# Patient Record
Sex: Female | Born: 1990 | Race: White | Hispanic: No | Marital: Single
Health system: Southern US, Community
[De-identification: ages and names within clinical notes are randomized; demographics above are authoritative.]

---

## 2013-02-11 ENCOUNTER — Emergency Department: Payer: Self-pay | Admitting: Emergency Medicine

## 2013-02-11 LAB — URINALYSIS, COMPLETE
BILIRUBIN, UR: NEGATIVE
BLOOD: NEGATIVE
Glucose,UR: NEGATIVE mg/dL (ref 0–75)
Leukocyte Esterase: NEGATIVE
Nitrite: NEGATIVE
PH: 5 (ref 4.5–8.0)
PROTEIN: NEGATIVE
RBC,UR: 1 /HPF (ref 0–5)
Specific Gravity: 1.025 (ref 1.003–1.030)
Squamous Epithelial: 1
WBC UR: 3 /HPF (ref 0–5)

## 2013-02-11 LAB — COMPREHENSIVE METABOLIC PANEL
ANION GAP: 7 (ref 7–16)
Albumin: 4.3 g/dL (ref 3.4–5.0)
Alkaline Phosphatase: 91 U/L
BUN: 17 mg/dL (ref 7–18)
Bilirubin,Total: 0.7 mg/dL (ref 0.2–1.0)
CHLORIDE: 106 mmol/L (ref 98–107)
CO2: 23 mmol/L (ref 21–32)
Calcium, Total: 9.5 mg/dL (ref 8.5–10.1)
Creatinine: 0.86 mg/dL (ref 0.60–1.30)
EGFR (African American): >60
EGFR (Non-African Amer.): >60
GLUCOSE: 111 mg/dL — AB (ref 65–99)
OSMOLALITY: 274 (ref 275–301)
Potassium: 4 mmol/L (ref 3.5–5.1)
SGOT(AST): 24 U/L (ref 15–37)
SGPT (ALT): 25 U/L (ref 12–78)
SODIUM: 136 mmol/L (ref 136–145)
Total Protein: 8.6 g/dL — ABNORMAL HIGH (ref 6.4–8.2)

## 2013-02-11 LAB — CBC WITH DIFFERENTIAL/PLATELET
BASOS PCT: 0.3 %
Basophil #: 0.1 10*3/uL (ref 0.0–0.1)
Eosinophil #: 0.1 10*3/uL (ref 0.0–0.7)
Eosinophil %: 0.5 %
HCT: 43.8 % (ref 35.0–47.0)
HGB: 14.6 g/dL (ref 12.0–16.0)
LYMPHS PCT: 2.4 %
Lymphocyte #: 0.4 10*3/uL — ABNORMAL LOW (ref 1.0–3.6)
MCH: 30 pg (ref 26.0–34.0)
MCHC: 33.4 g/dL (ref 32.0–36.0)
MCV: 90 fL (ref 80–100)
Monocyte #: 0.7 x10 3/mm (ref 0.2–0.9)
Monocyte %: 4 %
NEUTROS ABS: 16.1 10*3/uL — AB (ref 1.4–6.5)
Neutrophil %: 92.8 %
Platelet: 242 10*3/uL (ref 150–440)
RBC: 4.88 10*6/uL (ref 3.80–5.20)
RDW: 13 % (ref 11.5–14.5)
WBC: 17.3 10*3/uL — ABNORMAL HIGH (ref 3.6–11.0)

## 2013-02-11 LAB — LIPASE, BLOOD: LIPASE: 113 U/L (ref 73–393)

## 2013-03-04 ENCOUNTER — Ambulatory Visit: Payer: Self-pay | Admitting: Obstetrics & Gynecology

## 2013-03-04 LAB — CBC
HCT: 40.6 % (ref 35.0–47.0)
HGB: 13.4 g/dL (ref 12.0–16.0)
MCH: 29.6 pg (ref 26.0–34.0)
MCHC: 33.1 g/dL (ref 32.0–36.0)
MCV: 89 fL (ref 80–100)
Platelet: 311 10*3/uL (ref 150–440)
RBC: 4.54 10*6/uL (ref 3.80–5.20)
RDW: 13.1 % (ref 11.5–14.5)
WBC: 7.7 10*3/uL (ref 3.6–11.0)

## 2013-03-04 LAB — PREGNANCY, URINE: Pregnancy Test, Urine: NEGATIVE m[IU]/mL

## 2013-03-21 ENCOUNTER — Ambulatory Visit: Payer: Self-pay | Admitting: Obstetrics & Gynecology

## 2013-03-24 ENCOUNTER — Emergency Department: Payer: Self-pay | Admitting: Emergency Medicine

## 2013-03-25 LAB — URINALYSIS, COMPLETE
Bacteria: NONE SEEN
Bilirubin,UR: NEGATIVE
Glucose,UR: NEGATIVE mg/dL (ref 0–75)
KETONE: NEGATIVE
LEUKOCYTE ESTERASE: NEGATIVE
Nitrite: NEGATIVE
Ph: 7 (ref 4.5–8.0)
Protein: NEGATIVE
RBC,UR: 80 /HPF (ref 0–5)
Specific Gravity: 1.018 (ref 1.003–1.030)
Squamous Epithelial: 4
Transitional Epi: <1
WBC UR: 5 /HPF (ref 0–5)

## 2013-03-25 LAB — COMPREHENSIVE METABOLIC PANEL
ALT: 27 U/L (ref 12–78)
ANION GAP: 3 — AB (ref 7–16)
Albumin: 3.6 g/dL (ref 3.4–5.0)
Alkaline Phosphatase: 67 U/L
BUN: 9 mg/dL (ref 7–18)
Bilirubin,Total: 0.4 mg/dL (ref 0.2–1.0)
CHLORIDE: 104 mmol/L (ref 98–107)
Calcium, Total: 9.1 mg/dL (ref 8.5–10.1)
Co2: 33 mmol/L — ABNORMAL HIGH (ref 21–32)
Creatinine: 0.76 mg/dL (ref 0.60–1.30)
EGFR (African American): >60
EGFR (Non-African Amer.): >60
GLUCOSE: 88 mg/dL (ref 65–99)
Osmolality: 278 (ref 275–301)
POTASSIUM: 3.9 mmol/L (ref 3.5–5.1)
SGOT(AST): 17 U/L (ref 15–37)
Sodium: 140 mmol/L (ref 136–145)
Total Protein: 7.4 g/dL (ref 6.4–8.2)

## 2013-03-25 LAB — CBC
HCT: 35.8 % (ref 35.0–47.0)
HGB: 11.9 g/dL — AB (ref 12.0–16.0)
MCH: 30 pg (ref 26.0–34.0)
MCHC: 33.3 g/dL (ref 32.0–36.0)
MCV: 90 fL (ref 80–100)
Platelet: 251 10*3/uL (ref 150–440)
RBC: 3.99 10*6/uL (ref 3.80–5.20)
RDW: 12.9 % (ref 11.5–14.5)
WBC: 7.2 10*3/uL (ref 3.6–11.0)

## 2013-03-27 LAB — PATHOLOGY REPORT

## 2014-04-26 NOTE — Op Note (Signed)
PATIENT NAME:  Kelli Daniel, [name] MR#:  [id number] DATE OF BIRTH:  [birth year]  DATE OF PROCEDURE:  03/21/2013  PREOPERATIVE DIAGNOSIS: Right ovarian cyst.  POSTOPERATIVE DIAGNOSIS: Right ovarian dermoid cyst.   SURGEON:  R. Annamarie MajorPaul Cristiana Yochim, MD  ANESTHESIA: General.   ESTIMATED BLOOD LOSS: 25 milliliters.   COMPLICATIONS: None.   FINDINGS: Large right ovary. The outer component was consistent with 700 milliliters of serous fluid. 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   SPECIMEN: Right ovarian cyst wall.   DISPOSITION: To recovery room stable.   TECHNIQUE: The patient is prepped and draped in the usual sterile fashion after adequate anesthesia is obtained in the dorsal lithotomy position. Bladder is drained with a Robinson catheter. A Hulka tenaculum is placed on the cervix for manipulation purposes.   Attention is then turned to the abdomen where a Veress needle is inserted through a 5 millimeter infraumbilical incision after Marcaine is used to anesthetize the skin. Veress needle placement is confirmed using the hanging drop technique and the abdomen is then insufflated with CO2 gas. A 5 millimeter  trocar is then inserted under visualization with the laparoscope with no injuries or bleeding noted. The patient is placed in Trendelenburg positioning and immediately you could see the large cyst and the ovary as it takes up most of the pelvis. A left lower quadrant 5 millimeter trocar was then inserted under direct visualization with the laparoscope with no injuries or bleeding noted and a suprapubic 11 millimeter  trocar is placed with careful placement not to puncture the cyst and no injuries or bleeding noted. The ovarian cyst is punctured and then a suction irrigator is placed within the cyst with aspiration of 700 milliliters of clear serous fluid from the cyst wall. Once it collapses all the way down, careful dissection of the cyst wall is then  performed to dissect it free from the normal ovarian cortex and normal ovarian tissue. A large cyst wall and a even a new cyst beneath this is removed with that cyst containing hair and sebaceous material consistent with dermoid cyst. It is minimally spilled into the pelvic cavity. It was placed in an Endo Catch bag and removed from the abdomen by extending the incision somewhat at the suprapubic site. It is sent to pathology for further review with frozen section analysis.   The pelvic cavity is irrigated with copious amounts of irrigation fluid to hopefully aspirate any contamination of the dermoid cyst. The cyst wall itself was collapsible and hemostatic. There is no concern for torsion. The fallopian tube is normal. The left fallopian tube and ovary is also normal. There may be a bit of endometriosis in the left cul-de-sac or this may be more reactive changes due to the cyst. All other anatomy appears normal. Fluid is aspirated. Gas is expelled. Trocars are removed.   The fascia at the suprapubic site is closed with a 0 Vicryl suture. The skin is closed with 4-0 Vicryl suture and then the skin there as well the other sites are also closed with Dermabond. The tenaculum is removed from the cervix.   The patient goes to the recovery room in stable condition.   All sponge, instrument, and needle counts are correct.    ____________________________ R. Annamarie MajorPaul Chaye Misch, MD [phone] D: 03/21/2013 14:59:14 ET T: 03/21/2013 20:40:15 ET JOB#: [id number]  cc: Dierdre Searles. Paul Tephanie Escorcia, MD, <Dictator> Nadara MustardOBERT P Merrily Tegeler MD ELECTRONICALLY SIGNED 03/22/2013 2:46

## 2015-11-28 IMAGING — CT CT ABD-PELV W/ CM
2 of 4 series · 17 of 46 positions shown, 19 images · IV contrast (isovue)
Comparison: None.

CLINICAL DATA: Generalized abdomen pain.  Leukocytosis.

EXAM:
CT ABDOMEN AND PELVIS WITH CONTRAST
TECHNIQUE: Multidetector CT imaging of the abdomen and pelvis was performed
using the standard protocol following bolus administration of
intravenous contrast.
CONTRAST:  100 mL Isovue 370

[Series 2: routine abd pel with · axial · 0.82mm/px · z∈[-1048,-598]mm · 14 of 100 slices shown, 16 images]
[im 5/100  soft-tissue]
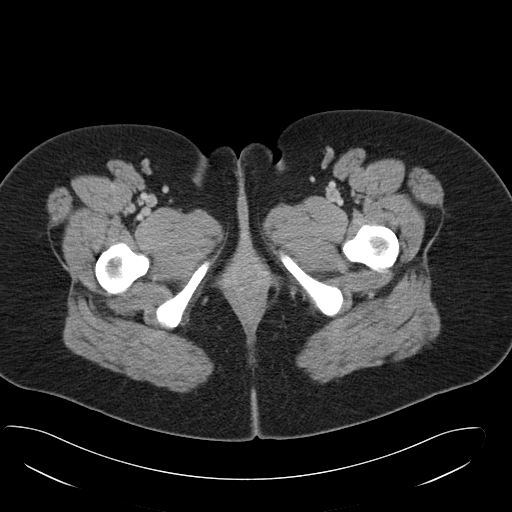
[im 5/100  bone]
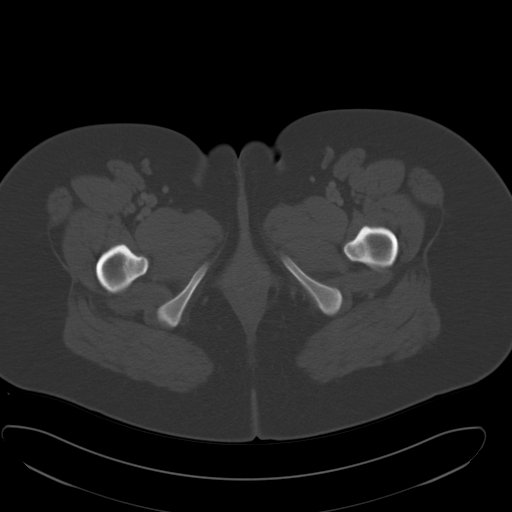
[im 13/100  soft-tissue]
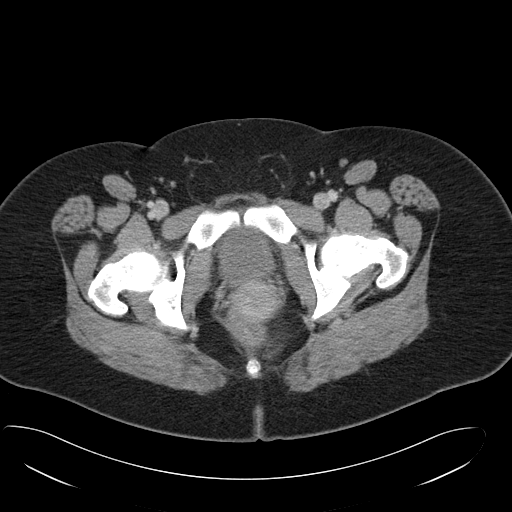
[im 21/100  soft-tissue]
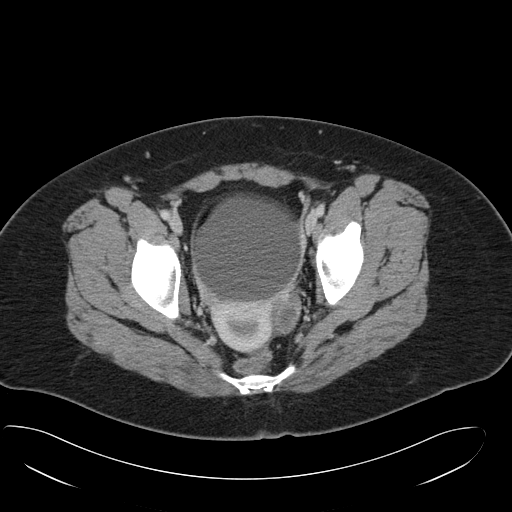
[im 25/100  soft-tissue]
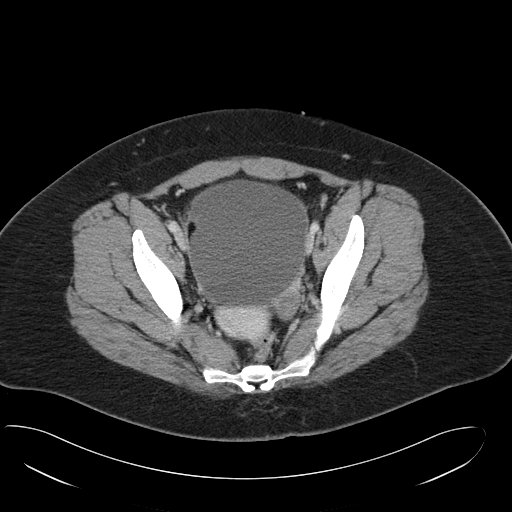
[im 34/100  soft-tissue]
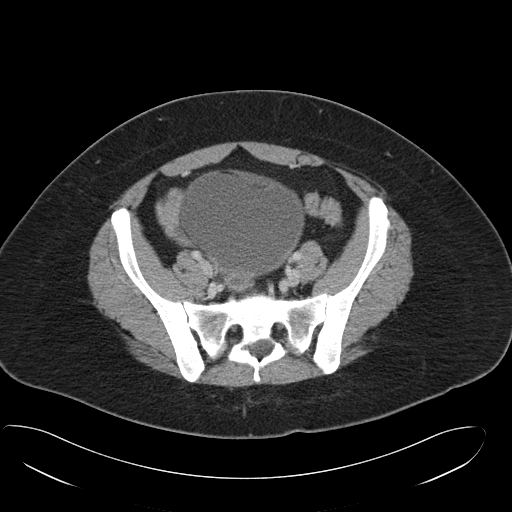
[im 42/100  soft-tissue]
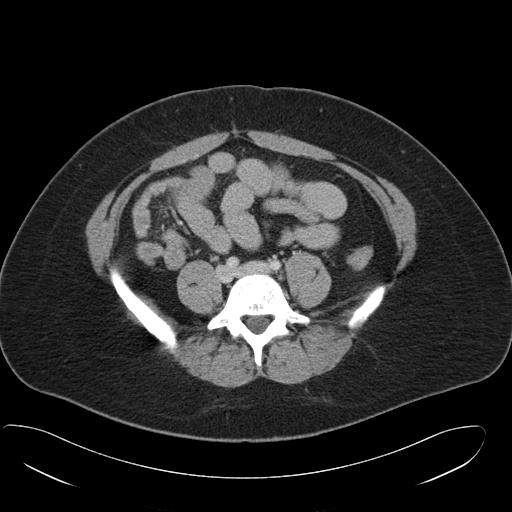
[im 46/100  soft-tissue]
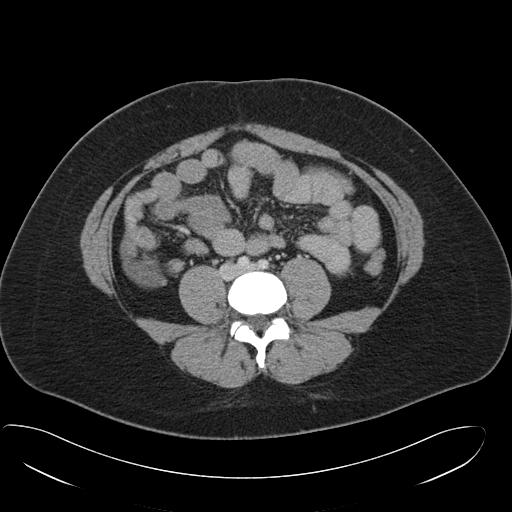
[im 54/100  soft-tissue]
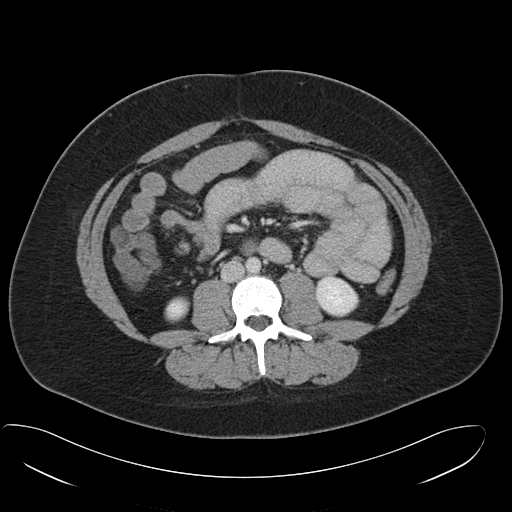
[im 58/100  soft-tissue]
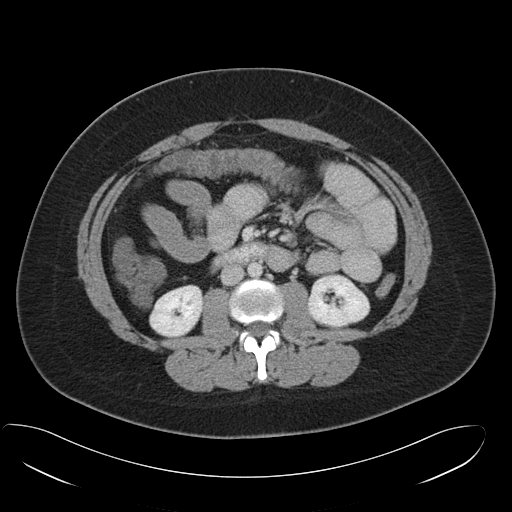
[im 58/100  bone]
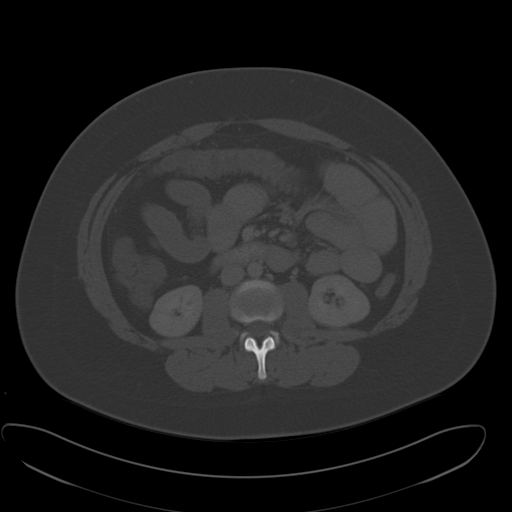
[im 67/100  soft-tissue]
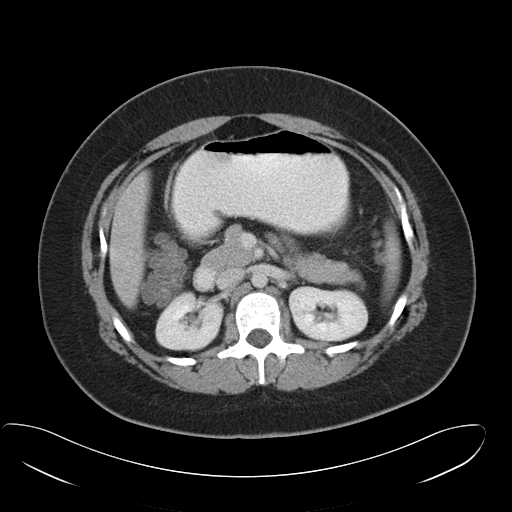
[im 75/100  soft-tissue]
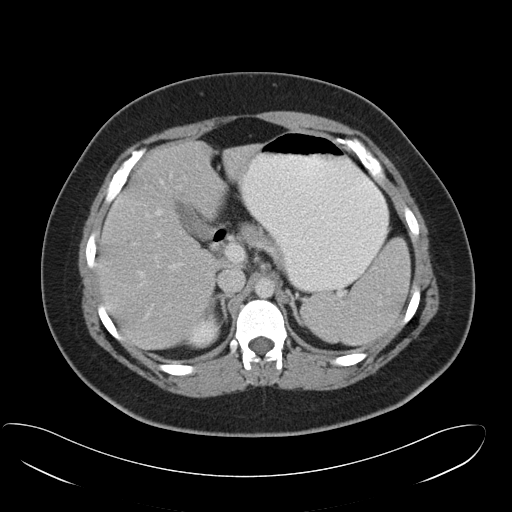
[im 79/100  soft-tissue]
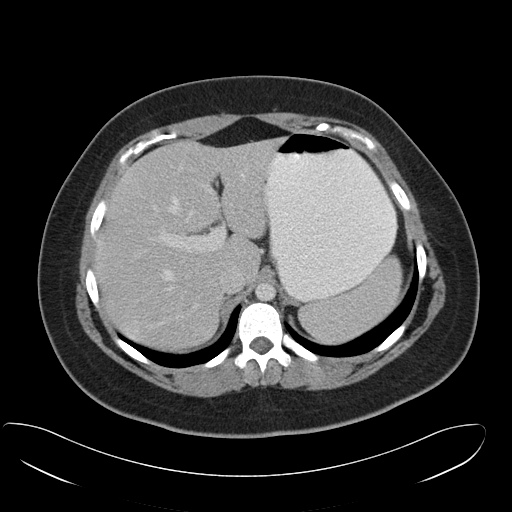
[im 87/100  soft-tissue]
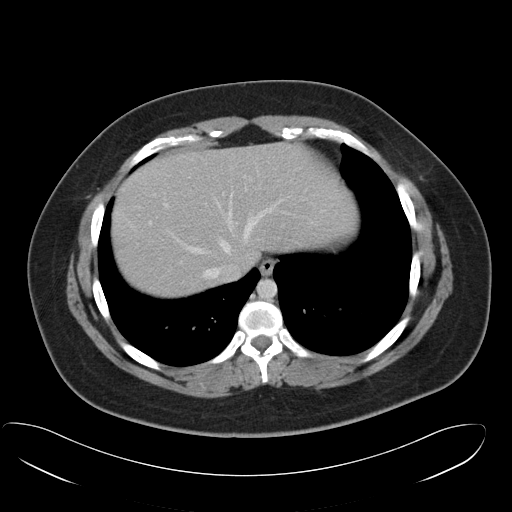
[im 95/100  soft-tissue]
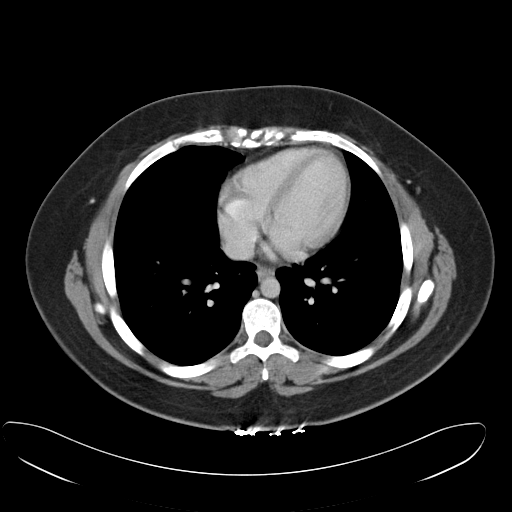

[Series 5: cor routine abd pel with · coronal · 0.84mm/px · 3 of 148 slices shown]
[im 50/148  soft-tissue]
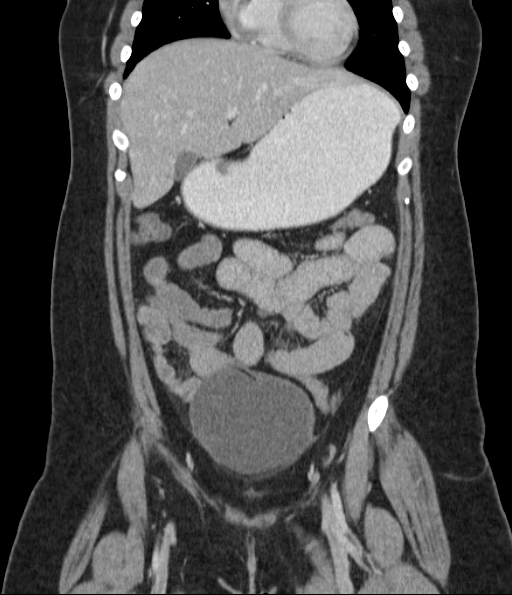
[im 66/148  soft-tissue]
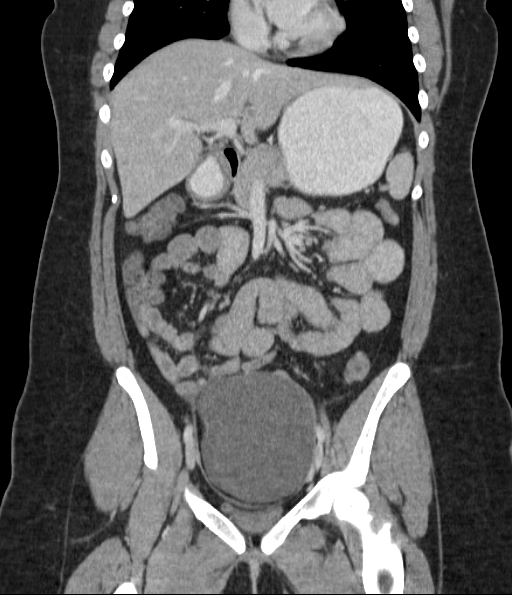
[im 82/148  soft-tissue]
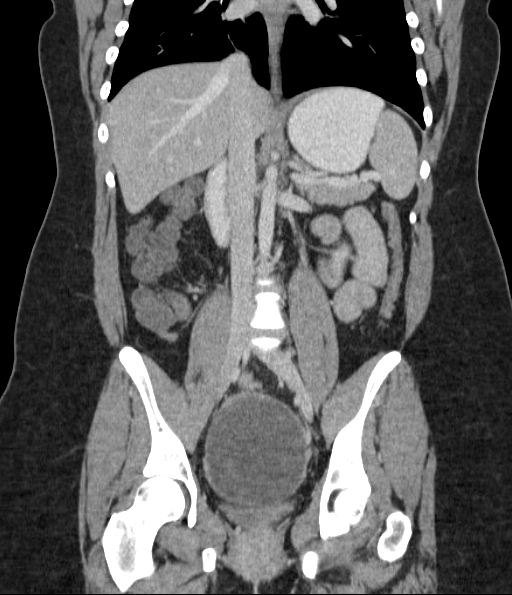

[17 of 46 positions shown; findings below may reference images not displayed]

FINDINGS: The liver, spleen, pancreas, gallbladder, adrenal glands and kidneys
are normal. There are mild prominent small bowel loops in the
proximal small bowel probably due to ileus. The colon is normal. The
appendix is normal.

In the pelvis, there is a 10.1 x 10.6 cm mass containing mostly
fluid with minimal fatty and soft tissue rim. The uterus is normal.
There is a cyst in normal size left ovary. Minimal free fluid is
identified within the pelvis, physiologic. The lung bases are clear.
No acute abnormalities identified within the visualized bones.
IMPRESSION: Mild prominent proximal small bowel loops probably ileus.

A 10.1 x 10.6 cm mass containing knee mostly fluid with minimal
fatty and soft tissue rim. This probably arises from the right
ovary. Differential diagnosis includes germ-cell tumor of versus
large cyst with surrounding rim of ovarian tissue in the adjacent
fat.
